# Patient Record
Sex: Female | Born: 1985 | Race: Black or African American | Hispanic: No | Marital: Single | Smoking: Light tobacco smoker
Health system: Southern US, Community
[De-identification: ages and names within clinical notes are randomized; demographics above are authoritative.]

## PROBLEM LIST (undated history)

## (undated) DIAGNOSIS — R102 Pelvic and perineal pain: Secondary | ICD-10-CM

## (undated) DIAGNOSIS — O139 Gestational [pregnancy-induced] hypertension without significant proteinuria, unspecified trimester: Secondary | ICD-10-CM

## (undated) DIAGNOSIS — A749 Chlamydial infection, unspecified: Secondary | ICD-10-CM

## (undated) HISTORY — DX: Pelvic and perineal pain: R10.2

## (undated) HISTORY — PX: CHOLECYSTECTOMY: SHX55

## (undated) HISTORY — PX: WISDOM TOOTH EXTRACTION: SHX21

## (undated) HISTORY — DX: Gestational (pregnancy-induced) hypertension without significant proteinuria, unspecified trimester: O13.9

## (undated) HISTORY — DX: Chlamydial infection, unspecified: A74.9

---

## 2012-04-05 HISTORY — PX: OTHER SURGICAL HISTORY: SHX169

## 2016-09-17 ENCOUNTER — Encounter: Payer: Self-pay | Admitting: *Deleted

## 2016-10-04 ENCOUNTER — Encounter: Payer: Self-pay | Admitting: Obstetrics and Gynecology

## 2016-10-13 ENCOUNTER — Encounter: Payer: Self-pay | Admitting: Obstetrics and Gynecology

## 2016-10-13 NOTE — Progress Notes (Signed)
This is a pre-visit preparation for a patient that did not show for appt.  [facility] Visit  10/13/2016            Patient name: Laura HolsterBrittney E Fine MRN [id number]  Date of birth: 10-Jan-1986  CC & HPI:  Laura Carr is a 31 y.o. female presenting today for   ROS:  ROS + +  Pertinent History Reviewed:   Reviewed: Significant for PIH, chlamydia, pelvic pain, C-section Medical         Past Medical History:  Diagnosis Date  . Chlamydia   . Pelvic pain   . PIH (pregnancy induced hypertension)                               Surgical Hx:    Past Surgical History:  Procedure Laterality Date  . CESAREAN SECTION    . essure  2014  . WISDOM TOOTH EXTRACTION     Medications: Reviewed & Updated - see associated section                       Current Outpatient Prescriptions:  .  ibuprofen (ADVIL,MOTRIN) 600 MG tablet, Take 600 mg by mouth every 8 (eight) hours as needed., Disp: , Rfl:    Social History: Reviewed -  reports that she has been smoking Cigarettes.  She does not have any smokeless tobacco history on file.  Objective Findings:  Vitals: There were no vitals taken for this visit.  Physical Examination: General appearance - alert, well appearing, and in no distress Mental status - alert, oriented to person, place, and time Pelvic -  VULVA: normal appearing vulva with no masses, tenderness or lesions,  VAGINA: normal appearing vagina with normal color and discharge, no lesions,  CERVIX:  UTERUS: , ADNEXA: normal adnexa in size, nontender and no masses   Assessment & Plan:   A: No show for appt 1.   P:  1. F/u  prn    By signing my name below, I, Izna Ahmed, attest that this documentation has been prepared under the direction and in the presence of Tilda BurrowFerguson, Ollivander See V, MD. Electronically Signed: Redge GainerIzna Ahmed, ED Scribe. 10/13/16. 1:48 PM. I personally performed the services described in this documentation, which was SCRIBED in my presence. The recorded  information has been reviewed and considered accurate. It has been edited as necessary during review. Tilda BurrowFERGUSON,Hiep Ollis V, MD

## 2016-12-24 ENCOUNTER — Encounter: Payer: Self-pay | Admitting: Obstetrics and Gynecology

## 2017-01-06 ENCOUNTER — Encounter (INDEPENDENT_AMBULATORY_CARE_PROVIDER_SITE_OTHER): Payer: Self-pay

## 2017-01-06 ENCOUNTER — Encounter: Payer: Self-pay | Admitting: Obstetrics and Gynecology

## 2017-01-06 ENCOUNTER — Ambulatory Visit (INDEPENDENT_AMBULATORY_CARE_PROVIDER_SITE_OTHER): Payer: Self-pay | Admitting: Obstetrics and Gynecology

## 2017-01-06 VITALS — BP 118/72 | HR 76 | Ht 62.0 in | Wt 183.0 lb

## 2017-01-06 DIAGNOSIS — R102 Pelvic and perineal pain: Secondary | ICD-10-CM

## 2017-01-06 NOTE — Progress Notes (Signed)
   [facility] Visit  01/06/2017            Patient name: Laura Carr MRN [id number]  Date of birth: Feb 19, 1986  CC & HPI:  SHAVANNA FURNARI is a 31 y.o. female presenting today for constant pain in back and pelvic. She has no associating symptoms. She has had Essure birth control for 5 year, and wants to have an U/S done. She has had her gallbladder removed last year due to believing it was causing back pain, which led to new pelvic pain that began. No alleviating factors noted. Pt denies trying anything for relief.   ROS:  ROS +pelvic pain -fever All systems are negative except as noted in the HPI and PMH.   Pertinent History Reviewed:   Reviewed: Significant for Essure BC, chlamydia, pelvic pain Medical         Past Medical History:  Diagnosis Date  . Chlamydia   . Pelvic pain   . PIH (pregnancy induced hypertension)                               Surgical Hx:    Past Surgical History:  Procedure Laterality Date  . CESAREAN SECTION    . CHOLECYSTECTOMY    . essure  2014  . WISDOM TOOTH EXTRACTION     Medications: Reviewed & Updated - see associated section                       Current Outpatient Prescriptions:  .  ibuprofen (ADVIL,MOTRIN) 600 MG tablet, Take 600 mg by mouth every 8 (eight) hours as needed., Disp: , Rfl:    Social History: Reviewed -  reports that she has been smoking Cigarettes.  She has never used smokeless tobacco.  Objective Findings:  Vitals: Blood pressure 118/72, pulse 76, height  (1.575 m), weight 183 lb (83 kg), last menstrual period 12/31/2016.  Physical Examination: General appearance - alert, well appearing, and in no distress Mental status - alert, oriented to person, place, and time Pelvic - not indicated  Discussion: Discussed with pt the best way to progress forward in order to determine if her pain is being caused by Essure BC.   Assessment & Plan:   A:  1. Chronic pelvic pain, hx of Essure  contraception  P:  1. Schedule an transvaginal U/S for the following week.    By signing my name below, I, Izna Ahmed, attest that this documentation has been prepared under the direction and in the presence of Tilda Burrow, MD. Electronically Signed: Redge Gainer, Medical Scribe. 01/06/17. 2:29 PM.  I personally performed the services described in this documentation, which was SCRIBED in my presence. The recorded information has been reviewed and considered accurate. It has been edited as necessary during review. Tilda Burrow, MD

## 2017-01-19 ENCOUNTER — Ambulatory Visit: Payer: Self-pay

## 2017-01-19 ENCOUNTER — Telehealth: Payer: Self-pay | Admitting: *Deleted

## 2017-01-19 ENCOUNTER — Ambulatory Visit: Payer: Self-pay | Admitting: Obstetrics and Gynecology

## 2017-01-19 DIAGNOSIS — R102 Pelvic and perineal pain: Secondary | ICD-10-CM

## 2017-01-20 NOTE — Telephone Encounter (Signed)
Called and scheduled [name] for Oct 24 @ 12:30. Patient states she cannot do this day/time. Number given for patient to reschedule.

## 2017-01-26 ENCOUNTER — Ambulatory Visit (HOSPITAL_COMMUNITY)
Admission: RE | Admit: 2017-01-26 | Discharge: 2017-01-26 | Disposition: A | Discharge status: Home / Self Care | Payer: Self-pay | Source: Ambulatory Visit | Attending: Obstetrics and Gynecology | Admitting: Obstetrics and Gynecology

## 2017-01-26 DIAGNOSIS — R102 Pelvic and perineal pain: Secondary | ICD-10-CM | POA: Insufficient documentation

## 2017-01-27 ENCOUNTER — Telehealth: Payer: Self-pay | Admitting: Obstetrics & Gynecology

## 2017-01-27 NOTE — Telephone Encounter (Signed)
Patient called regarding U/S results. Informed U/S was normal but patient states she is still having pelvic pain. Wants to know what direction to go now. Please advise.

## 2017-02-13 NOTE — Telephone Encounter (Signed)
Patient with essure, will need face to face appt to discuss options.

## 2017-02-15 ENCOUNTER — Telehealth: Payer: Self-pay | Admitting: *Deleted

## 2017-02-15 NOTE — Telephone Encounter (Signed)
Called patient to inform her that per Dr Lucianne MussFerugson since she has Essure and still experiencing pelvic pain, that she would need and appointment to discuss options. Pt verbalized understanding. Will get patient scheduled.

## 2017-03-02 ENCOUNTER — Ambulatory Visit: Payer: Self-pay | Admitting: Obstetrics and Gynecology

## 2017-06-06 ENCOUNTER — Ambulatory Visit: Payer: Self-pay | Admitting: Obstetrics and Gynecology

## 2017-06-07 ENCOUNTER — Ambulatory Visit: Payer: Self-pay | Admitting: Obstetrics and Gynecology

## 2017-06-13 ENCOUNTER — Encounter: Payer: Self-pay | Admitting: Obstetrics and Gynecology

## 2017-06-13 ENCOUNTER — Ambulatory Visit (INDEPENDENT_AMBULATORY_CARE_PROVIDER_SITE_OTHER): Payer: Self-pay | Admitting: Obstetrics and Gynecology

## 2017-06-13 ENCOUNTER — Other Ambulatory Visit (HOSPITAL_COMMUNITY)
Admission: RE | Admit: 2017-06-13 | Discharge: 2017-06-13 | Disposition: A | Discharge status: Home / Self Care | Payer: Self-pay | Source: Ambulatory Visit | Attending: Obstetrics and Gynecology | Admitting: Obstetrics and Gynecology

## 2017-06-13 VITALS — BP 120/80 | HR 98 | Ht 63.0 in | Wt 187.2 lb

## 2017-06-13 DIAGNOSIS — N9412 Deep dyspareunia: Secondary | ICD-10-CM | POA: Insufficient documentation

## 2017-06-13 DIAGNOSIS — R102 Pelvic and perineal pain: Secondary | ICD-10-CM

## 2017-06-13 DIAGNOSIS — B9689 Other specified bacterial agents as the cause of diseases classified elsewhere: Secondary | ICD-10-CM | POA: Insufficient documentation

## 2017-06-13 DIAGNOSIS — Z124 Encounter for screening for malignant neoplasm of cervix: Secondary | ICD-10-CM

## 2017-06-13 DIAGNOSIS — N760 Acute vaginitis: Secondary | ICD-10-CM | POA: Insufficient documentation

## 2017-06-13 LAB — POCT URINALYSIS DIPSTICK
Glucose, UA: NEGATIVE
Ketones, UA: NEGATIVE
LEUKOCYTES UA: NEGATIVE
NITRITE UA: NEGATIVE
Protein, UA: NEGATIVE
RBC UA: NEGATIVE

## 2017-06-13 MED ORDER — METRONIDAZOLE 0.75 % VA GEL: 1.0000 | Freq: Every day | VAGINAL | 1 refills | Status: AC

## 2017-06-13 NOTE — Patient Instructions (Signed)
Laparoscopically Assisted Vaginal Hysterectomy A laparoscopically assisted vaginal hysterectomy (LAVH) is a surgical procedure to remove the uterus and cervix, and sometimes the ovaries and fallopian tubes. During an LAVH, some of the surgical removal is done through the vagina, and the rest is done through a few small surgical cuts (incisions) in the abdomen. This procedure is usually considered in women when a vaginal hysterectomy is not an option. Your health care provider will discuss the risks and benefits of the different surgical techniques at your appointment. Generally, recovery time is faster and there are fewer complications after laparoscopic procedures than after open incisional procedures. Tell a health care provider about:  Any allergies you have.  All medicines you are taking, including vitamins, herbs, eye drops, creams, and over-the-counter medicines.  Any problems you or family members have had with anesthetic medicines.  Any blood disorders you have.  Any surgeries you have had.  Any medical conditions you have. What are the risks? Generally, this is a safe procedure. However, as with any procedure, complications can occur. Possible complications include:  Allergies to medicines.  Difficulty breathing.  Bleeding.  Infection.  Damage to other structures near your uterus and cervix.  What happens before the procedure?  Ask your health care provider about changing or stopping your regular medicines.  Take certain medicines, such as a colon-emptying preparation, as directed.  Do not eat or drink anything for at least 8 hours before your surgery.  Stop smoking if you smoke. Stopping will improve your health after surgery.  Arrange for a ride home after surgery and for help at home during recovery. What happens during the procedure?  An IV tube will be put into one of your veins in order to give you fluids and medicines.  You will receive medicines to relax  you and medicines that make you sleep (general anesthetic).  You may have a flexible tube (catheter) put into your bladder to drain urine.  You may have a tube put through your nose or mouth that goes into your stomach (nasogastric tube). The nasogastric tube removes digestive fluids and prevents you from feeling nauseated and from vomiting.  Tight-fitting (compression) stockings will be placed on your legs to promote circulation.  Three to four small incisions will be made in your abdomen. An incision also will be made in your vagina. Probes and tools will be inserted into the small incisions. The uterus and cervix are removed (and possibly your ovaries and fallopian tubes) through your vagina as well as through the small incisions that were made in the abdomen.  Your vagina is then sewn back to normal. What happens after the procedure?  You may have a liquid diet temporarily. You will most likely return to, and tolerate, your usual diet the day after surgery.  You will be passing urine through a catheter. It will be removed the day after surgery.  Your temperature, breathing rate, heart rate, blood pressure, and oxygen level will be monitored regularly.  You will still wear compression stockings on your legs until you are able to move around.  You will use a special device or do breathing exercises to keep your lungs clear.  You will be encouraged to walk as soon as possible. This information is not intended to replace advice given to you by your health care provider. Make sure you discuss any questions you have with your health care provider. Document Released: 03/11/2011 Document Revised: 08/28/2015 Document Reviewed: 10/05/2012 Elsevier Interactive Patient Education  2018 Elsevier   Inc.  

## 2017-06-13 NOTE — Progress Notes (Addendum)
[facility] Visit  @DATE @            Patient name: Laura Carr MRN [id number]  Date of birth: 29-Dec-1985  CC & HPI:  Laura Carr is a 32 y.o. female presenting today for visit regarding pelvic pain.  She is a 32 year old female status post cesarean section x1, [id number] with primary cesarean section for twins and then 4 subsequent vaginal birth after cesareans.  She reports approximately 2-year history of pelvic discomfort which she relates to about the time she had cholecystectomy.. I suspect this time he was coincidental.  She has had an essure sterilization procedure several years ago, (5).  She was seen 01/2017, and this is her first visit since then Ultrasound performed at [facility] at that time; LINICAL DATA:  Chronic pelvic pain.  EXAM: TRANSABDOMINAL AND TRANSVAGINAL ULTRASOUND OF PELVIS  TECHNIQUE: Both transabdominal and transvaginal ultrasound examinations of the pelvis were performed. Transabdominal technique was performed for global imaging of the pelvis including uterus, ovaries, adnexal regions, and pelvic cul-de-sac. It was necessary to proceed with endovaginal exam following the transabdominal exam to visualize the endometrium and ovaries.  COMPARISON:  None  FINDINGS: Uterus  Measurements: 8.5 x 7.2 x 5.6 cm. No fibroids or other mass visualized.  Endometrium  Thickness: 4 mm.  No focal abnormality visualized.  Right ovary  Measurements: 5.6 x 3.1 x 2.7 cm. Normal appearance/no adnexal mass.  Left ovary  Measurements: 5.2 x 2.5 x 1.9 cm. Normal appearance/no adnexal mass.  Other findings  No abnormal free fluid.  IMPRESSION: Normal appearance of uterus and ovaries. No pelvic mass or other significant abnormality identified.   Electronically Signed   By: Myles Rosenthal M.D.   On: 01/26/2017 13:19  ROS:  ROS Dyspareunia worse in missionary position  Pertinent History Reviewed:   Reviewed:  Significant for no fever or chills.  Stable relationship single sexual partner Medical         Past Medical History:  Diagnosis Date  . Chlamydia   . Pelvic pain   . PIH (pregnancy induced hypertension)                               Surgical Hx:    Past Surgical History:  Procedure Laterality Date  . CESAREAN SECTION    . CHOLECYSTECTOMY    . essure  2014  . WISDOM TOOTH EXTRACTION     Medications: Reviewed & Updated - see associated section                       Current Outpatient Medications:  .  acetaminophen (TYLENOL) 325 MG tablet, Take 650 mg by mouth every 6 (six) hours as needed., Disp: , Rfl:  .  ibuprofen (ADVIL,MOTRIN) 600 MG tablet, Take 600 mg by mouth every 8 (eight) hours as needed., Disp: , Rfl:  .  metroNIDAZOLE (METROGEL) 0.75 % vaginal gel, Place 1 Applicatorful vaginally at bedtime. Apply one applicatorful to vagina at bedtime for 5 days, Disp: 70 g, Rfl: 1   Social History: Reviewed -  reports that she has been smoking cigarettes.  she has never used smokeless tobacco.  Objective Findings:  Vitals: Blood pressure 120/80, pulse 98, height 5\' 3"  (1.6 m), weight 187 lb 3.2 oz (84.9 kg), last menstrual period 05/20/2017.  PHYSICAL EXAMINATION General appearance - alert, well appearing, and in no distress Mental status - alert, oriented  to person, place, and time Chest - not examined Heart - normal rate and regular rhythm Abdomen - soft, nontender, nondistended, no masses or organomegaly well-healed C-section scar low on abdomen Breasts -  Skin - normal coloration and turgor, no rashes, no suspicious skin lesions noted  PELVIC External genitalia -normal Vulva -normal Vagina -well supported cervix with large bulbous 4 cm transverse diameter cervix filling the upper third of the vagina, distance to cervix is approximately 7 cm from the introitus Cervix -as above  Uterus -tender to cervix contact uterus is anteflexed, nonpurulent.  Manipulation of the cervix  reproduces the deep thrust dyspareunia Adnexa -no appreciable masses exam limited by body habitus [place] -malodorous discharge wet prep shows clue cells no trichomonas and generous white cells Rectal - rectal exam not indicated, declined by the patient    Assessment & Plan:   A:  1.  dyspareunia due to cervix contact. 2. BV 3. S/p ESSURE sterilization  P:  1.  Brochure on LAVH, discussed salpingectomy and ovarian preservation 2. Metronidazole 500 twice daily times 7 days 3. Follow-up visit in 2 weeks after patient had chance to review information sheets

## 2017-06-16 LAB — CYTOLOGY - PAP
DIAGNOSIS: UNDETERMINED — AB
HPV (WINDOPATH): DETECTED — AB

## 2017-06-20 ENCOUNTER — Telehealth: Payer: Self-pay | Admitting: *Deleted

## 2017-06-20 NOTE — Telephone Encounter (Signed)
Patient informed of abnormal pap and need for colpo. Patient already scheduled for 4/8 so will cancel 3/25 appointment and she can discuss surgery at the 4/8 visit.

## 2017-06-27 ENCOUNTER — Ambulatory Visit: Payer: Self-pay | Admitting: Obstetrics and Gynecology

## 2017-07-11 ENCOUNTER — Encounter: Payer: Self-pay | Admitting: Obstetrics and Gynecology

## 2017-08-15 ENCOUNTER — Encounter: Payer: Self-pay | Admitting: Obstetrics and Gynecology

## 2017-09-01 ENCOUNTER — Encounter: Payer: Self-pay | Admitting: Obstetrics and Gynecology

## 2017-09-22 ENCOUNTER — Encounter: Payer: Self-pay | Admitting: Obstetrics and Gynecology

## 2017-12-16 ENCOUNTER — Encounter (INDEPENDENT_AMBULATORY_CARE_PROVIDER_SITE_OTHER): Payer: Self-pay

## 2017-12-16 ENCOUNTER — Other Ambulatory Visit: Payer: Self-pay | Admitting: Obstetrics and Gynecology

## 2017-12-16 ENCOUNTER — Encounter: Payer: Self-pay | Admitting: Obstetrics and Gynecology

## 2017-12-16 ENCOUNTER — Ambulatory Visit (INDEPENDENT_AMBULATORY_CARE_PROVIDER_SITE_OTHER): Payer: 59 | Admitting: Obstetrics and Gynecology

## 2017-12-16 VITALS — BP 112/71 | HR 74 | Ht 62.0 in | Wt 184.2 lb

## 2017-12-16 DIAGNOSIS — R87610 Atypical squamous cells of undetermined significance on cytologic smear of cervix (ASC-US): Secondary | ICD-10-CM | POA: Diagnosis not present

## 2017-12-16 DIAGNOSIS — Z3202 Encounter for pregnancy test, result negative: Secondary | ICD-10-CM | POA: Diagnosis not present

## 2017-12-16 DIAGNOSIS — R87810 Cervical high risk human papillomavirus (HPV) DNA test positive: Secondary | ICD-10-CM

## 2017-12-16 DIAGNOSIS — N870 Mild cervical dysplasia: Secondary | ICD-10-CM

## 2017-12-16 LAB — POCT URINE PREGNANCY: PREG TEST UR: NEGATIVE

## 2017-12-16 NOTE — Progress Notes (Signed)
Patient ID: Laura HolsterBrittney E Carr, female   DOB: 1986/02/21, 32 y.o.   MRN: [id number]   Laura Carr 32 y.o. [id number] here for colposcopy for atypical squamous cellularity of undetermined significance (ASCUS) pap smear on 06/13/2017.  Discussed role for HPV in cervical dysplasia, need for surveillance.  Patient given informed consent, signed copy in the chart, time out was performed.  Placed in lithotomy position. Cervix viewed with speculum and colposcope after application of acetic acid. Hurricane numbing spray was used prior to biopsy  Colposcopy adequate? Yes Biopsies obtained at 9 o'clock. x2 bites ECC specimen obtained All specimens were labelled and sent to pathology.   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 Will follow up pathology and manage accordingly.  Routine preventative health maintenance measures emphasized.   By signing my name below, I, Arnette NorrisMari Johnson, attest that this documentation has been prepared under the direction and in the presence of Tilda BurrowFerguson, Keaja Reaume V, MD. Electronically Signed: Arnette NorrisMari Johnson Medical Scribe. 12/16/17. 12:08 PM.  I personally performed the services described in this documentation, which was SCRIBED in my presence. The recorded information has been reviewed and considered accurate. It has been edited as necessary during review. Tilda BurrowJohn V Roslynn Holte, MD

## 2017-12-27 ENCOUNTER — Telehealth: Payer: Self-pay | Admitting: *Deleted

## 2017-12-27 NOTE — Telephone Encounter (Signed)
Patient informed CIN I OF anterior lip of cervix. ECC negative. Pt can be followed with Pap and HPV testing in a year. Verbalized understanding with no further questions.

## 2019-01-13 IMAGING — US US PELVIS COMPLETE TRANSABD/TRANSVAG
1 series · 14 of 25 positions shown · non-contrast
Comparison: None

CLINICAL DATA: Chronic pelvic pain.

EXAM:
TRANSABDOMINAL AND TRANSVAGINAL ULTRASOUND OF PELVIS
TECHNIQUE: Both transabdominal and transvaginal ultrasound examinations of the
pelvis were performed. Transabdominal technique was performed for
global imaging of the pelvis including uterus, ovaries, adnexal
regions, and pelvic cul-de-sac. It was necessary to proceed with
endovaginal exam following the transabdominal exam to visualize the
endometrium and ovaries.

[Series 1: us pelvis complete transabd/transvag · 0.24mm/px · 14 of 49 slices shown]
[im 1/49]
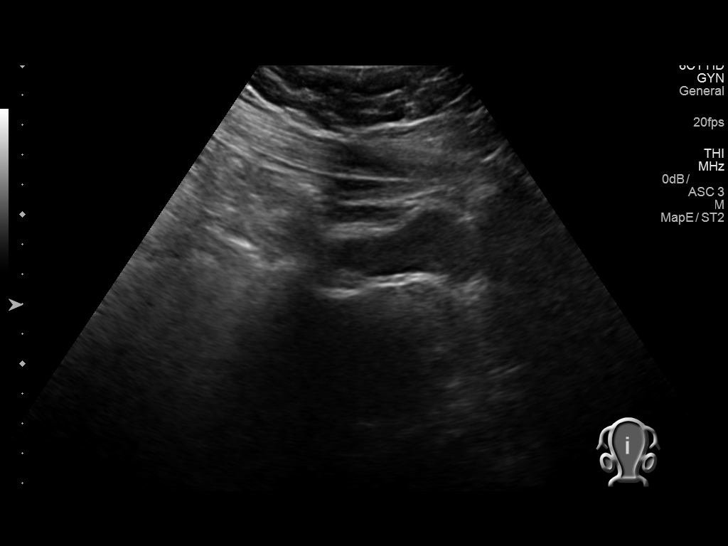
[im 5/49]
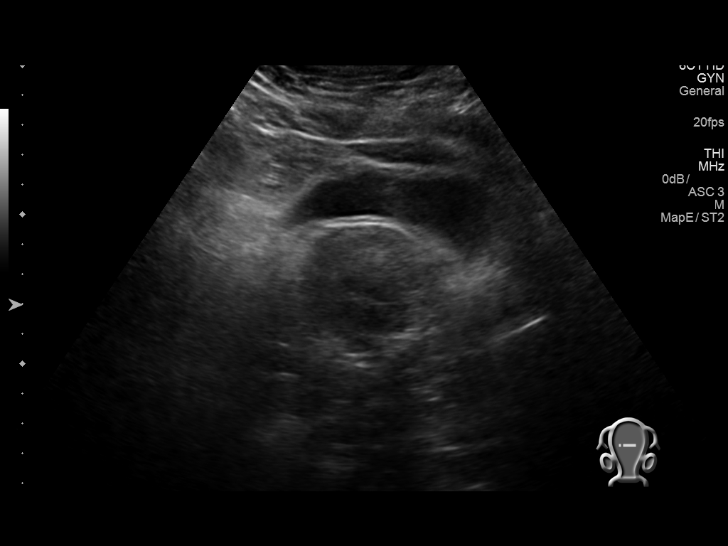
[im 9/49]
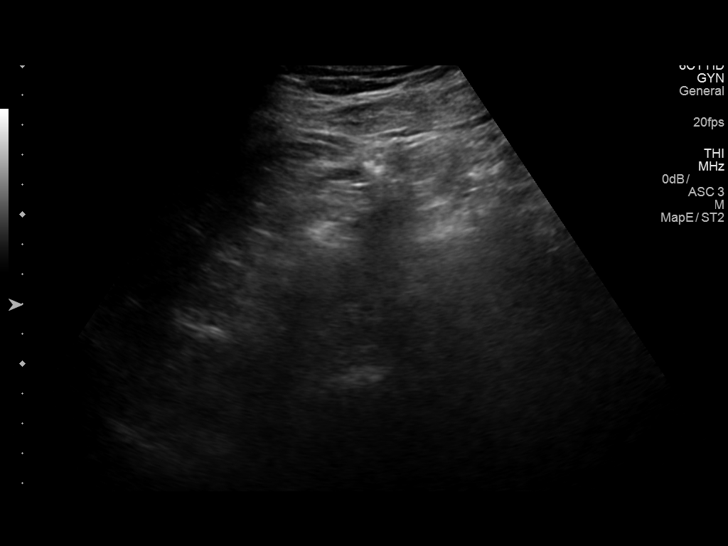
[im 13/49]
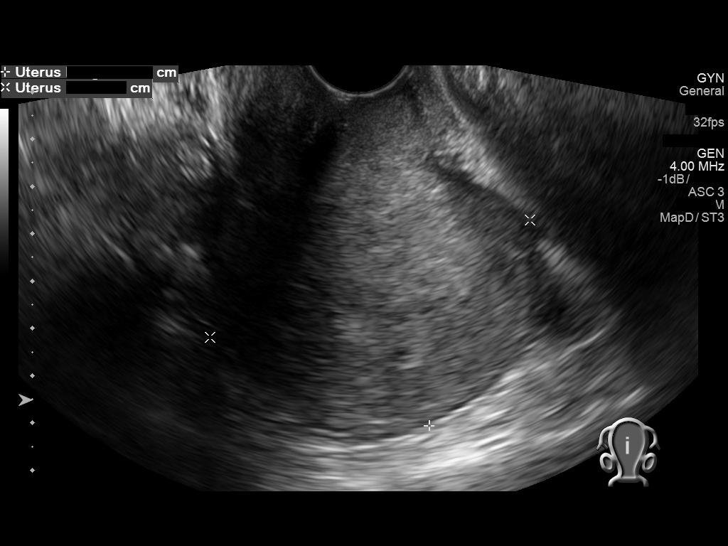
[im 17/49]
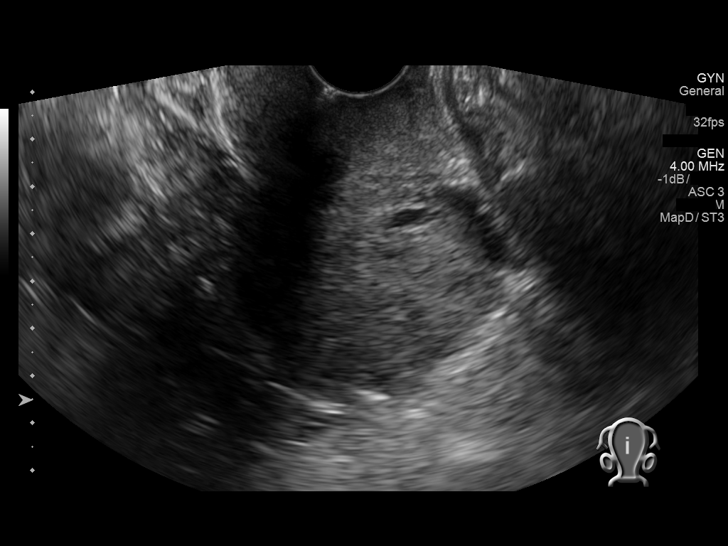
[im 19/49]
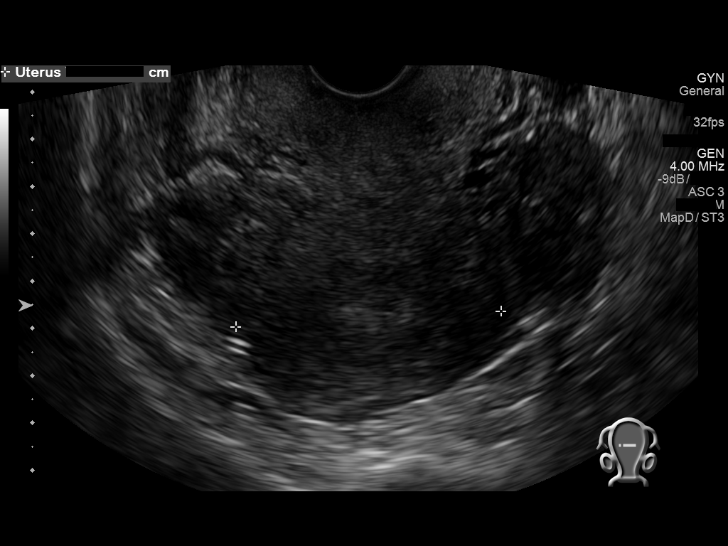
[im 23/49]
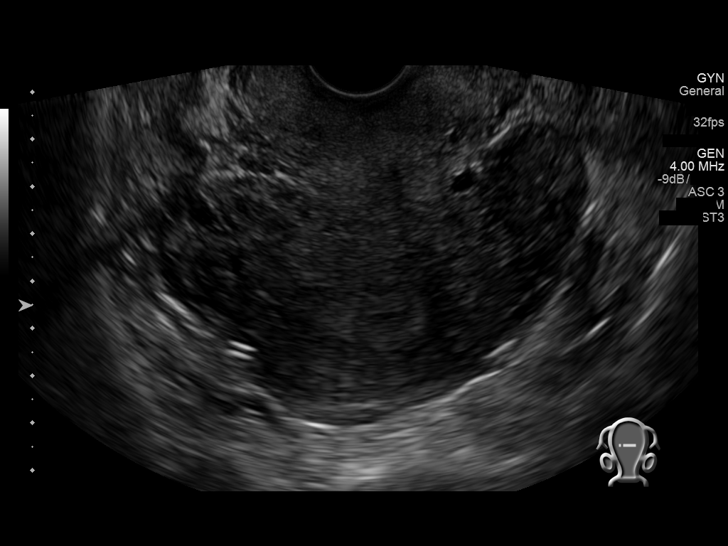
[im 27/49]
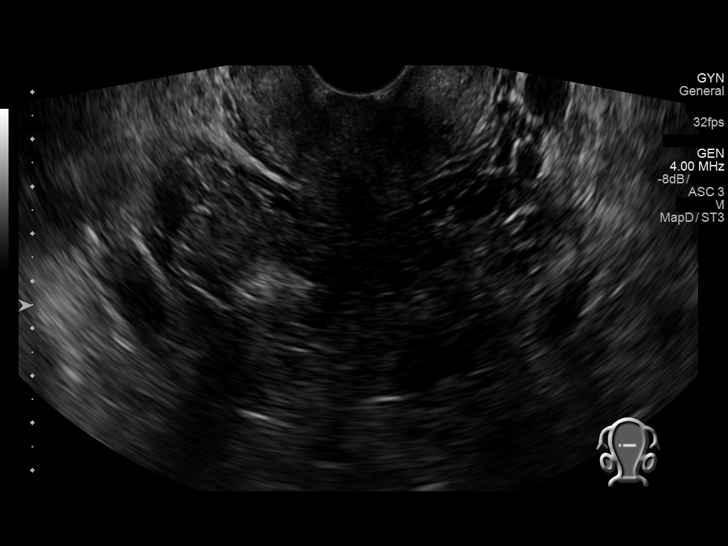
[im 31/49]
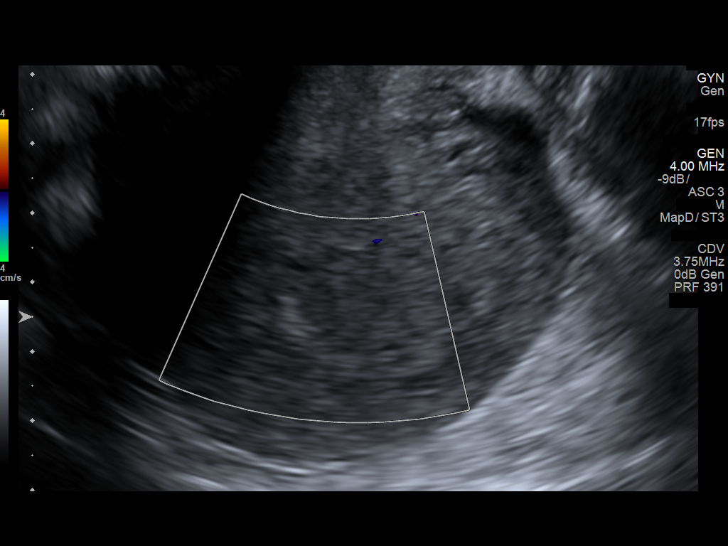
[im 33/49]
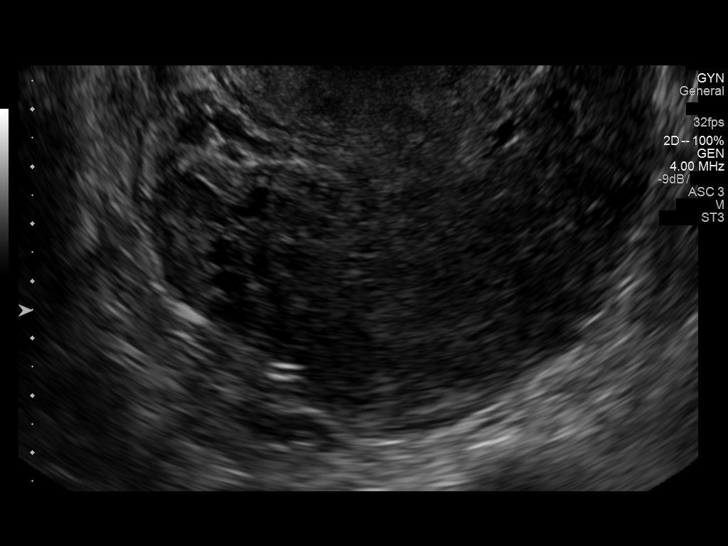
[im 37/49]
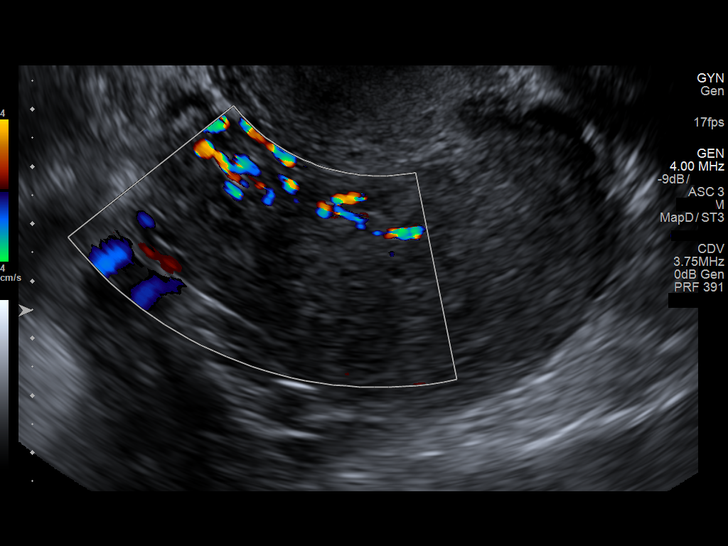
[im 41/49]
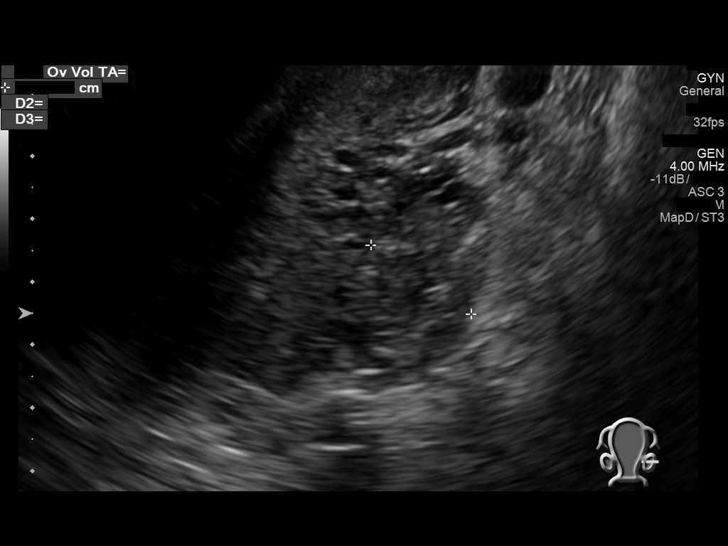
[im 45/49]
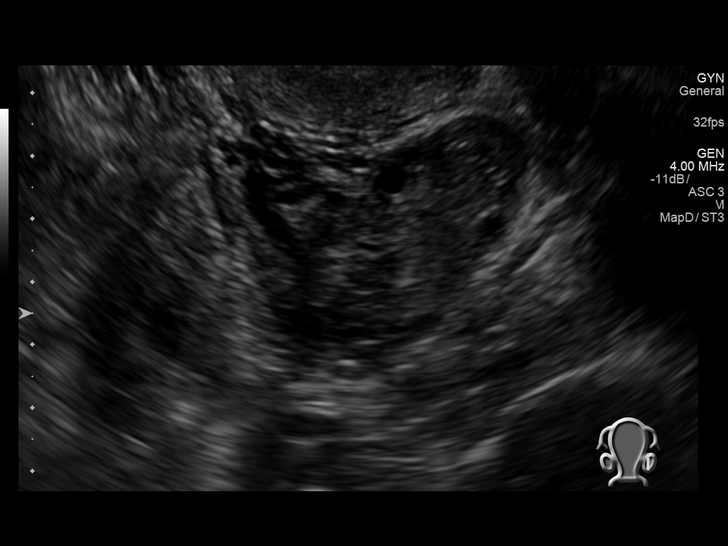
[im 49/49]
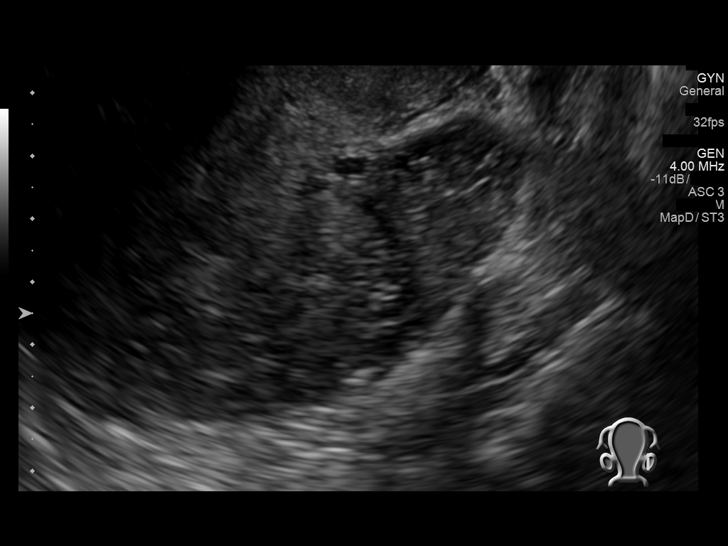

[14 of 25 positions shown; findings below may reference images not displayed]

FINDINGS: Uterus

Measurements: 8.5 x 7.2 x 5.6 cm. No fibroids or other mass
visualized.

Endometrium

Thickness: 4 mm.  No focal abnormality visualized.

Right ovary

Measurements: 5.6 x 3.1 x 2.7 cm. Normal appearance/no adnexal mass.

Left ovary

Measurements: 5.2 x 2.5 x 1.9 cm. Normal appearance/no adnexal mass.

Other findings

No abnormal free fluid.
IMPRESSION: Normal appearance of uterus and ovaries. No pelvic mass or other
significant abnormality identified.

## 2022-01-27 ENCOUNTER — Ambulatory Visit: Payer: BLUE CROSS/BLUE SHIELD | Admitting: Orthopedic Surgery
